# Patient Record
Sex: Male | Born: 2000 | Race: White | Hispanic: No | Marital: Single | Smoking: Current every day smoker
Health system: Southern US, Community
[De-identification: ages and names within clinical notes are randomized; demographics above are authoritative.]

## PROBLEM LIST (undated history)

## (undated) DIAGNOSIS — J45909 Unspecified asthma, uncomplicated: Secondary | ICD-10-CM

## (undated) HISTORY — PX: HERNIA REPAIR: SHX51

---

## 2019-04-01 ENCOUNTER — Other Ambulatory Visit: Payer: Self-pay

## 2019-04-01 ENCOUNTER — Emergency Department (HOSPITAL_BASED_OUTPATIENT_CLINIC_OR_DEPARTMENT_OTHER): Payer: Medicaid Other

## 2019-04-01 ENCOUNTER — Emergency Department (HOSPITAL_BASED_OUTPATIENT_CLINIC_OR_DEPARTMENT_OTHER)
Admission: EM | Admit: 2019-04-01 | Discharge: 2019-04-01 | Disposition: A | Discharge status: Home / Self Care | Payer: Medicaid Other | Attending: Emergency Medicine | Admitting: Emergency Medicine

## 2019-04-01 ENCOUNTER — Encounter (HOSPITAL_BASED_OUTPATIENT_CLINIC_OR_DEPARTMENT_OTHER): Payer: Self-pay | Admitting: *Deleted

## 2019-04-01 DIAGNOSIS — R1031 Right lower quadrant pain: Secondary | ICD-10-CM | POA: Insufficient documentation

## 2019-04-01 DIAGNOSIS — J45909 Unspecified asthma, uncomplicated: Secondary | ICD-10-CM | POA: Diagnosis not present

## 2019-04-01 DIAGNOSIS — F17210 Nicotine dependence, cigarettes, uncomplicated: Secondary | ICD-10-CM | POA: Diagnosis not present

## 2019-04-01 HISTORY — DX: Unspecified asthma, uncomplicated: J45.909

## 2019-04-01 LAB — CBC WITH DIFFERENTIAL/PLATELET
Abs Immature Granulocytes: 0.02 10*3/uL (ref 0.00–0.07)
Basophils Absolute: 0 10*3/uL (ref 0.0–0.1)
Basophils Relative: 0 %
Eosinophils Absolute: 0.1 10*3/uL (ref 0.0–0.5)
Eosinophils Relative: 1 %
HCT: 51.3 % (ref 39.0–52.0)
Hemoglobin: 17.8 g/dL — ABNORMAL HIGH (ref 13.0–17.0)
Immature Granulocytes: 0 %
Lymphocytes Relative: 26 %
Lymphs Abs: 1.8 10*3/uL (ref 0.7–4.0)
MCH: 30.6 pg (ref 26.0–34.0)
MCHC: 34.7 g/dL (ref 30.0–36.0)
MCV: 88.1 fL (ref 80.0–100.0)
Monocytes Absolute: 0.6 10*3/uL (ref 0.1–1.0)
Monocytes Relative: 8 %
Neutro Abs: 4.2 10*3/uL (ref 1.7–7.7)
Neutrophils Relative %: 65 %
Platelets: 226 10*3/uL (ref 150–400)
RBC: 5.82 MIL/uL — ABNORMAL HIGH (ref 4.22–5.81)
RDW: 11.7 % (ref 11.5–15.5)
WBC: 6.6 10*3/uL (ref 4.0–10.5)
nRBC: 0 % (ref 0.0–0.2)

## 2019-04-01 LAB — COMPREHENSIVE METABOLIC PANEL
ALT: 15 U/L (ref 0–44)
AST: 23 U/L (ref 15–41)
Albumin: 4.8 g/dL (ref 3.5–5.0)
Alkaline Phosphatase: 62 U/L (ref 38–126)
Anion gap: 9 (ref 5–15)
BUN: 19 mg/dL (ref 6–20)
CO2: 25 mmol/L (ref 22–32)
Calcium: 9.3 mg/dL (ref 8.9–10.3)
Chloride: 102 mmol/L (ref 98–111)
Creatinine, Ser: 0.88 mg/dL (ref 0.61–1.24)
GFR calc Af Amer: >60 mL/min (ref >60)
GFR calc non Af Amer: >60 mL/min (ref >60)
Glucose, Bld: 81 mg/dL (ref 70–99)
Potassium: 4.1 mmol/L (ref 3.5–5.1)
Sodium: 136 mmol/L (ref 135–145)
Total Bilirubin: 2.6 mg/dL — ABNORMAL HIGH (ref 0.3–1.2)
Total Protein: 7.9 g/dL (ref 6.5–8.1)

## 2019-04-01 LAB — URINALYSIS, ROUTINE W REFLEX MICROSCOPIC
Bilirubin Urine: NEGATIVE
Glucose, UA: NEGATIVE mg/dL
Ketones, ur: 15 mg/dL — AB
Leukocytes,Ua: NEGATIVE
Nitrite: NEGATIVE
Protein, ur: NEGATIVE mg/dL
Specific Gravity, Urine: >1.03 — ABNORMAL HIGH (ref 1.005–1.030)
pH: 6 (ref 5.0–8.0)

## 2019-04-01 LAB — URINALYSIS, MICROSCOPIC (REFLEX)

## 2019-04-01 LAB — LIPASE, BLOOD: Lipase: 26 U/L (ref 11–51)

## 2019-04-01 MED ORDER — METHOCARBAMOL 500 MG PO TABS: 500.0000 mg | ORAL_TABLET | Freq: Two times a day (BID) | ORAL | 0 refills | Status: AC

## 2019-04-01 MED ORDER — KETOROLAC TROMETHAMINE 30 MG/ML IJ SOLN
30.0000 mg | Freq: Once | INTRAMUSCULAR | Status: AC
  Administered 2019-04-01: 30 mg via INTRAVENOUS
  Filled 2019-04-01: qty 1

## 2019-04-01 MED ORDER — ONDANSETRON HCL 4 MG/2ML IJ SOLN
4.0000 mg | Freq: Once | INTRAMUSCULAR | Status: AC
  Administered 2019-04-01: 4 mg via INTRAVENOUS
  Filled 2019-04-01: qty 2

## 2019-04-01 MED ORDER — IOHEXOL 300 MG/ML  SOLN
100.0000 mL | Freq: Once | INTRAMUSCULAR | Status: AC | PRN
  Administered 2019-04-01: 100 mL via INTRAVENOUS

## 2019-04-01 NOTE — ED Triage Notes (Signed)
Pt c/o right lower abd pain  After lifting heavy object x 1 week ago HX hernia

## 2019-04-01 NOTE — ED Provider Notes (Signed)
[facility] EMERGENCY DEPARTMENT Provider Note   CSN: [id number] Arrival date & time: 04/01/19  1648    History   Chief Complaint Chief Complaint  Patient presents with  . Abdominal Pain    HPI Michael Horne is a 18 y.o. male who presents for evaluation of right lower quadrant abdominal pain that began about a week ago.  He states that symptoms initially began while he was at work.  He states that he was lifting something heavy and felt a "tearing type pain in his right lower quadrant."  Since then, he has had constant pain to his right lower abdomen and right groin.  He is concerned that he have developed a hernia.  He reports history of bilateral hernias with here in 2016.  Since then, he always has some chronic pain but states that this is felt worse.  Pain is worse when he is moving or walking around.  He denies any diarrhea or constipation.  He does report one episode of vomiting yesterday.  He states he has had some decreased appetite which he states is abnormal for him.  He has not noted any fevers.  He has been taking Aleve for the pain with no improvement.  Patient denies any dysuria, hematuria, chest pain, difficulty breathing.     The history is provided by the patient.    Past Medical History:  Diagnosis Date  . Asthma     There are no active problems to display for this patient.   Past Surgical History:  Procedure Laterality Date  . HERNIA REPAIR          Home Medications    Prior to Admission medications   Medication Sig Start Date End Date Taking? Authorizing Provider  methocarbamol (ROBAXIN) 500 MG tablet Take 1 tablet (500 mg total) by mouth 2 (two) times daily. 04/01/19   Maxwell CaulLayden, Rishaan Gunner A, PA-C    Family History History reviewed. No pertinent family history.  Social History Social History   Tobacco Use  . Smoking status: Current Every Day Smoker    Packs/day: 0.50    Types: Cigarettes  . Smokeless tobacco: Never Used  Substance Use  Topics  . Alcohol use: Not Currently  . Drug use: Yes    Types: Marijuana     Allergies   Patient has no known allergies.   Review of Systems Review of Systems  Constitutional: Positive for appetite change. Negative for fever.  Respiratory: Negative for cough and shortness of breath.   Cardiovascular: Negative for chest pain.  Gastrointestinal: Positive for abdominal pain, nausea and vomiting.  Genitourinary: Negative for dysuria and hematuria.  Neurological: Negative for headaches.  All other systems reviewed and are negative.    Physical Exam Updated Vital Signs BP 109/73 (BP Location: Right Arm)   Pulse 89   Temp 98 F (36.7 C)   Resp 18   Ht 6\' 3"  (1.905 m)   Wt 86.2 kg   SpO2 100%   BMI 23.75 kg/m   Physical Exam Vitals signs and nursing note reviewed. Exam conducted with a chaperone present.  Constitutional:      Appearance: Normal appearance. He is well-developed.  HENT:     Head: Normocephalic and atraumatic.  Eyes:     General: Lids are normal.     Conjunctiva/sclera: Conjunctivae normal.     Pupils: Pupils are equal, round, and reactive to light.  Neck:     Musculoskeletal: Full passive range of motion without pain.  Cardiovascular:  Rate and Rhythm: Normal rate and regular rhythm.     Pulses: Normal pulses.     Heart sounds: Normal heart sounds. No murmur. No friction rub. No gallop.   Pulmonary:     Effort: Pulmonary effort is normal.     Breath sounds: Normal breath sounds.  Abdominal:     Palpations: Abdomen is soft. Abdomen is not rigid.     Tenderness: There is abdominal tenderness in the right lower quadrant. There is right CVA tenderness. There is no guarding.     Hernia: No hernia is present. There is no hernia in the left inguinal area or right inguinal area.     Comments: Abdomen is soft, nondistended.  Diffuse tenderness in his right lower quadrant with no specific focal point.  No obvious tenderness noted at McBurney's point.  Mild  right-sided CVA tenderness.  Rigidity, guarding.  No peritoneal signs. No palpable abdominal hernia.   Genitourinary:    Penis: Normal. No lesions.      Scrotum/Testes: Normal. Cremasteric reflex is present.        Right: Tenderness or swelling not present. Right testis is descended.        Left: Tenderness or swelling not present. Left testis is descended.     Comments: The exam was performed with a chaperone present. Normal male genitalia. No evidence of rash, ulcers or lesions.  Normal penis. Bilateral testicles are without any overlying warmth, erythema, edema. No palpable mass. No bell clap deformity. Cremasteric reflex present. No hernia palpated bilaterally.  Musculoskeletal: Normal range of motion.  Skin:    General: Skin is warm and dry.     Capillary Refill: Capillary refill takes less than 2 seconds.  Neurological:     Mental Status: He is alert and oriented to person, place, and time.  Psychiatric:        Speech: Speech normal.      ED Treatments / Results  Labs (all labs ordered are listed, but only abnormal results are displayed) Labs Reviewed  URINALYSIS, ROUTINE W REFLEX MICROSCOPIC - Abnormal; Notable for the following components:      Result Value   Specific Gravity, Urine >1.030 (*)    Hgb urine dipstick TRACE (*)    Ketones, ur 15 (*)    All other components within normal limits  COMPREHENSIVE METABOLIC PANEL - Abnormal; Notable for the following components:   Total Bilirubin 2.6 (*)    All other components within normal limits  CBC WITH DIFFERENTIAL/PLATELET - Abnormal; Notable for the following components:   RBC 5.82 (*)    Hemoglobin 17.8 (*)    All other components within normal limits  URINALYSIS, MICROSCOPIC (REFLEX) - Abnormal; Notable for the following components:   Bacteria, UA RARE (*)    All other components within normal limits  LIPASE, BLOOD    EKG None  Radiology Ct Abdomen Pelvis W Contrast  Result Date: 04/01/2019 CLINICAL DATA:   Acute right lower quadrant abdominal pain. EXAM: CT ABDOMEN AND PELVIS WITH CONTRAST TECHNIQUE: Multidetector CT imaging of the abdomen and pelvis was performed using the standard protocol following bolus administration of intravenous contrast. CONTRAST:  125mL OMNIPAQUE IOHEXOL 300 MG/ML  SOLN COMPARISON:  None. FINDINGS: Lower chest: No acute abnormality. Hepatobiliary: No focal liver abnormality is seen. No gallstones, gallbladder wall thickening, or biliary dilatation. Pancreas: Unremarkable. No pancreatic ductal dilatation or surrounding inflammatory changes. Spleen: Normal in size without focal abnormality. Adrenals/Urinary Tract: Adrenal glands are unremarkable. Kidneys are normal, without renal calculi, focal  lesion, or hydronephrosis. Bladder is unremarkable. Stomach/Bowel: Stomach is within normal limits. Appendix appears normal. No evidence of bowel wall thickening, distention, or inflammatory changes. Vascular/Lymphatic: No significant vascular findings are present. No enlarged abdominal or pelvic lymph nodes. Reproductive: Prostate is unremarkable. Other: No abdominal wall hernia or abnormality. No abdominopelvic ascites. Musculoskeletal: No acute or significant osseous findings. IMPRESSION: No abnormality seen in the abdomen or pelvis. Electronically Signed   By: Lupita RaiderJames  Green Jr M.D.   On: 04/01/2019 19:17    Procedures Procedures (including critical care time)  Medications Ordered in ED Medications  ondansetron (ZOFRAN) injection 4 mg (4 mg Intravenous Given 04/01/19 1807)  ketorolac (TORADOL) 30 MG/ML injection 30 mg (30 mg Intravenous Given 04/01/19 1808)  iohexol (OMNIPAQUE) 300 MG/ML solution 100 mL (100 mLs Intravenous Contrast Given 04/01/19 1854)     Initial Impression / Assessment and Plan / ED Course  I have reviewed the triage vital signs and the nursing notes.  Pertinent labs & imaging results that were available during my care of the patient were reviewed by me and considered in  my medical decision making (see chart for details).        18 year old male who presents for evaluation of right sided abdominal/pelvic pain x1 week.  History of bilateral hernia repair in 2016.  Reports proxy 1 week ago, he was moving a heavy thing at work and felt like something was tearing in his right lower quadrant/pelvic region.  Since then has had continued pain.  Also endorses decreased appetite as well as one episode of vomiting yesterday.  No fevers. Patient is afebrile, non-toxic appearing, sitting comfortably on examination table. Vital signs reviewed and stable.  On exam, he does have diffuse tenderness palpation noted to right lower quadrant as well as some mild right-sided CVA tenderness.  No palpable hernia noted on exam.  Additionally, no testicular warmth, erythema, edema, palpable mass noted.  Consider hernia though does not appear strangulated or incarcerated.  Also consider infectious etiology such as appendicitis given location of pain as well as decreased appetite and episode of vomiting.  History/physical exam is not concerning for kidney stone.  History/physical exam not concerning for testicular torsion.  Consider abdominal muscle strain.  CMP shows bili at 2.6.  Otherwise unremarkable.  Lipase within normal limits.  UA shows trace hemoglobin.  No infectious signs.  CBC without any significant leukocytosis or anemia.  CT abdomen pelvis shows no acute abnormality seen in abdomen or pelvis.  No evidence of abdominal wall hernia.  No evidence of appendicitis.  Re-evaluation of patient.  Patient resting comfortably in bed.  He states that his pain is somewhat improved. I discussed with him that this could be evidence of muscle strain given recent history.  On my evaluation, he has no evidence of inguinal hernia that I can palpate.  Additionally, his exam is not concerning for testicular torsion.  Encouraged at home supportive care measures.  We will send him home with a short course  of muscle relaxers for symptomatic relief. At this time, patient exhibits no emergent life-threatening condition that require further evaluation in ED or admission. Patient had ample opportunity for questions and discussion. All patient's questions were answered with full understanding. Strict return precautions discussed. Patient expresses understanding and agreement to plan.   Portions of this note were generated with Scientist, clinical (histocompatibility and immunogenetics)Dragon dictation software. Dictation errors may occur despite best attempts at proofreading.   Final Clinical Impressions(s) / ED Diagnoses   Final diagnoses:  Right lower quadrant  abdominal pain    ED Discharge Orders         Ordered    methocarbamol (ROBAXIN) 500 MG tablet  2 times daily     04/01/19 2029           Rosana HoesLayden, Azara Gemme A, PA-C 04/01/19 2106    Pricilla LovelessGoldston, Scott, MD 04/05/19 [phone]

## 2019-04-01 NOTE — ED Notes (Signed)
ED Provider at bedside. 

## 2019-04-01 NOTE — Discharge Instructions (Signed)
You can take Tylenol or Ibuprofen as directed for pain. You can alternate Tylenol and Ibuprofen every 4 hours. If you take Tylenol at 1pm, then you can take Ibuprofen at 5pm. Then you can take Tylenol again at 9pm.   Take Robaxin as prescribed. This medication will make you drowsy so do not drive or drink alcohol when taking it.  Follow-up with [facility] to establish a primary care doctor if you do not have one.   Follow-up with your surgeon.  Return the emergency department for any fevers, worsening abdominal pain, vomiting or any other worsening or concerning symptoms.

## 2020-12-23 IMAGING — CT CT ABDOMEN AND PELVIS WITH CONTRAST
2 of 5 series · 16 of 46 positions shown, 18 images · IV contrast (APPLIED)
Comparison: None.

CLINICAL DATA: Acute right lower quadrant abdominal pain.

EXAM:
CT ABDOMEN AND PELVIS WITH CONTRAST
TECHNIQUE: Multidetector CT imaging of the abdomen and pelvis was performed
using the standard protocol following bolus administration of
intravenous contrast.
CONTRAST:  100mL OMNIPAQUE IOHEXOL 300 MG/ML  SOLN

[Series 2: axial st · axial · 0.68mm/px · z∈[-665,-235]mm · 13 of 98 slices shown, 15 images]
[im 6/98  soft-tissue]
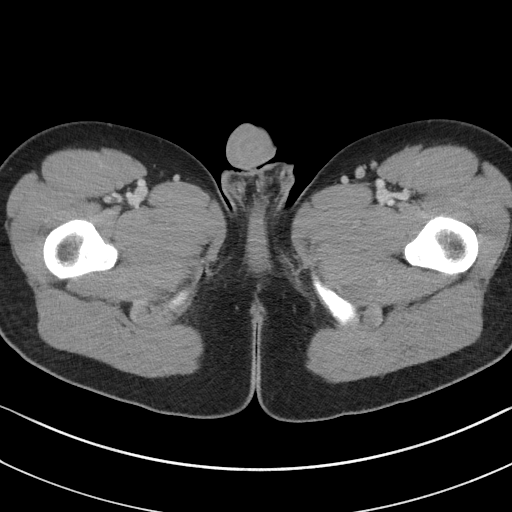
[im 6/98  bone]
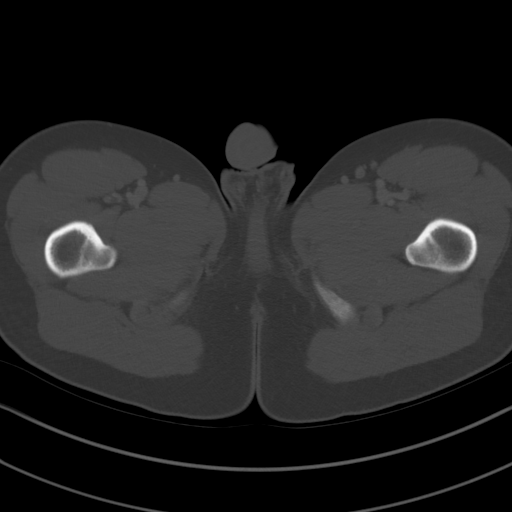
[im 11/98  soft-tissue]
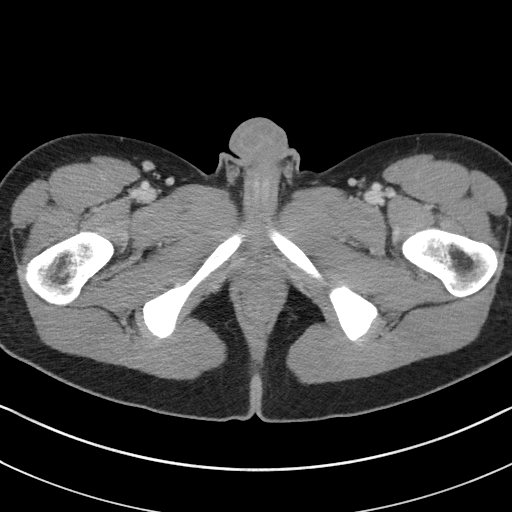
[im 22/98  soft-tissue]
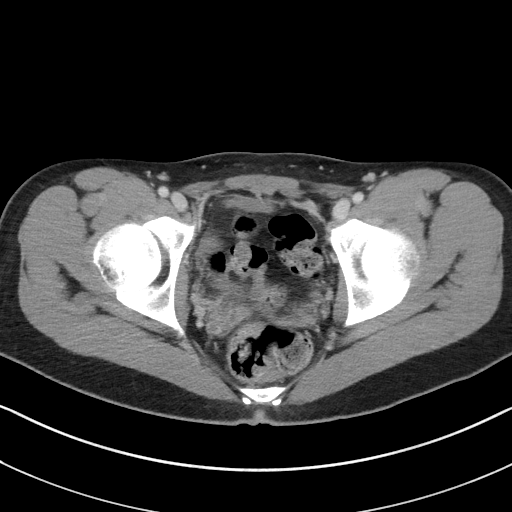
[im 27/98  soft-tissue]
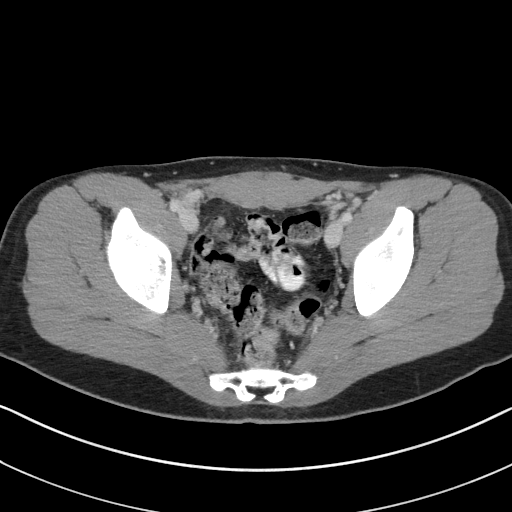
[im 33/98  soft-tissue]
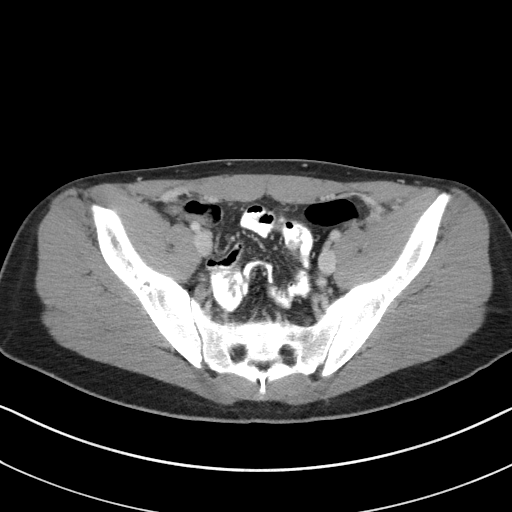
[im 44/98  soft-tissue]
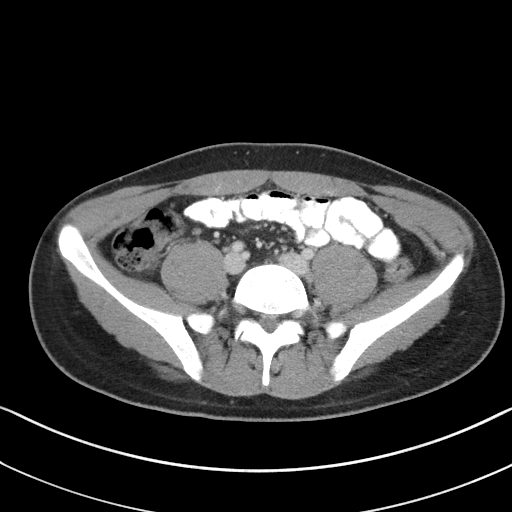
[im 49/98  soft-tissue]
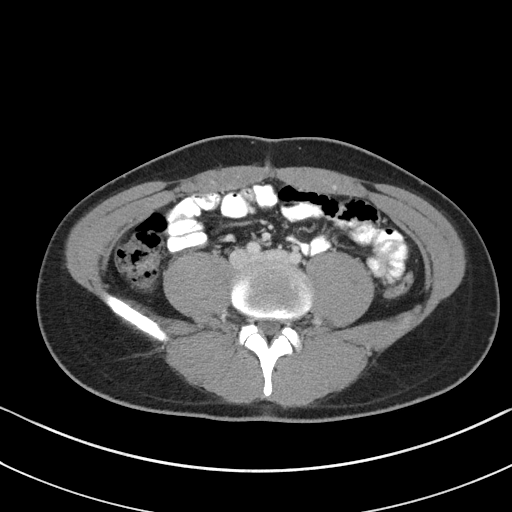
[im 54/98  soft-tissue]
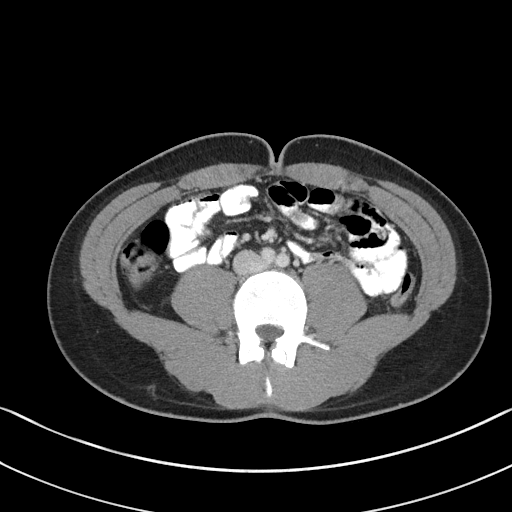
[im 65/98  soft-tissue]
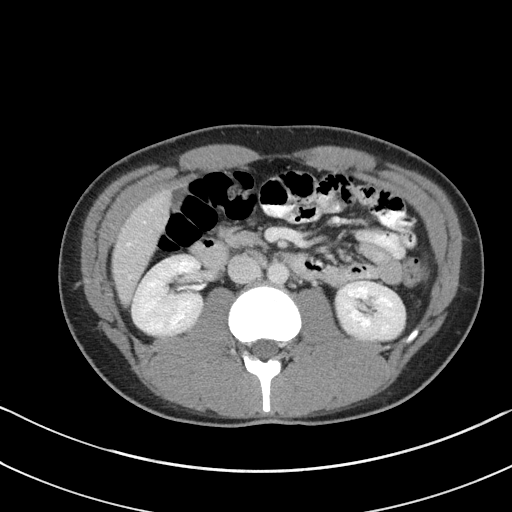
[im 65/98  bone]
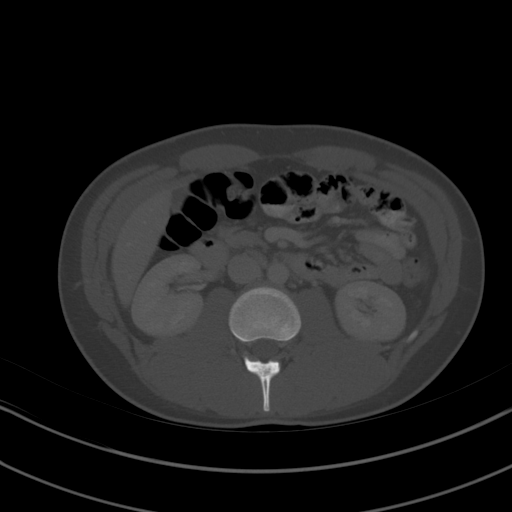
[im 71/98  soft-tissue]
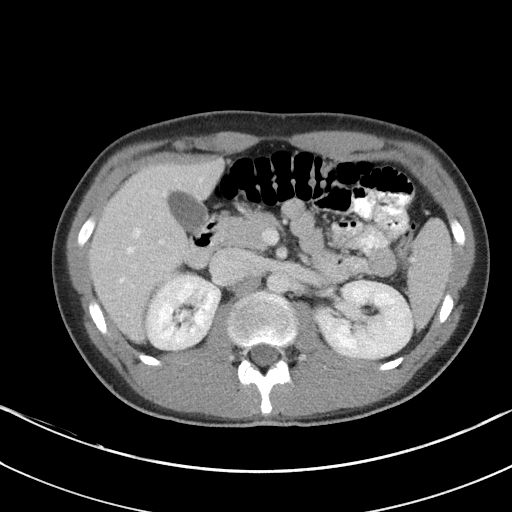
[im 76/98  soft-tissue]
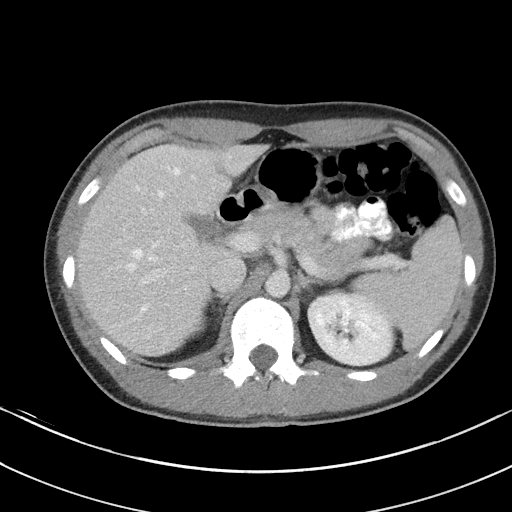
[im 87/98  soft-tissue]
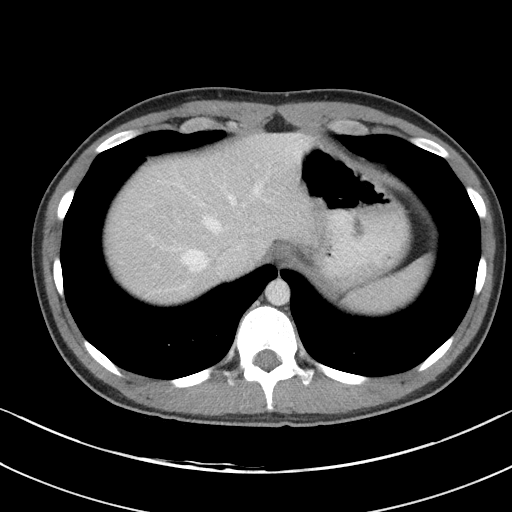
[im 92/98  soft-tissue]
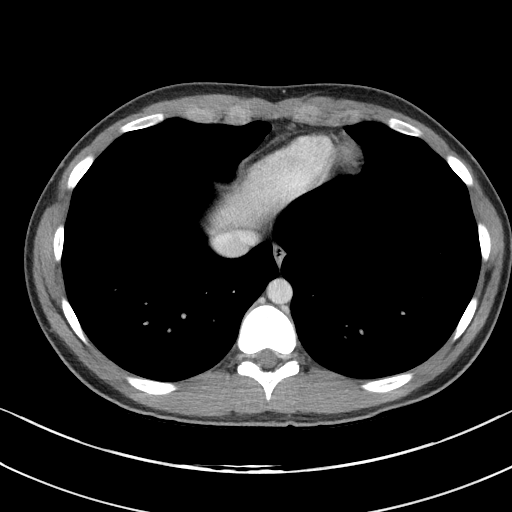

[Series 5: coronal st · coronal · 0.67mm/px · 3 of 79 slices shown]
[im 27/79  soft-tissue]
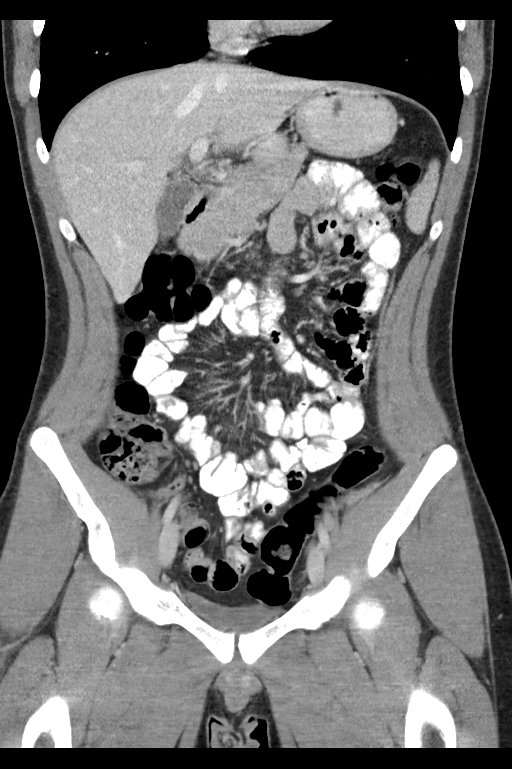
[im 35/79  soft-tissue]
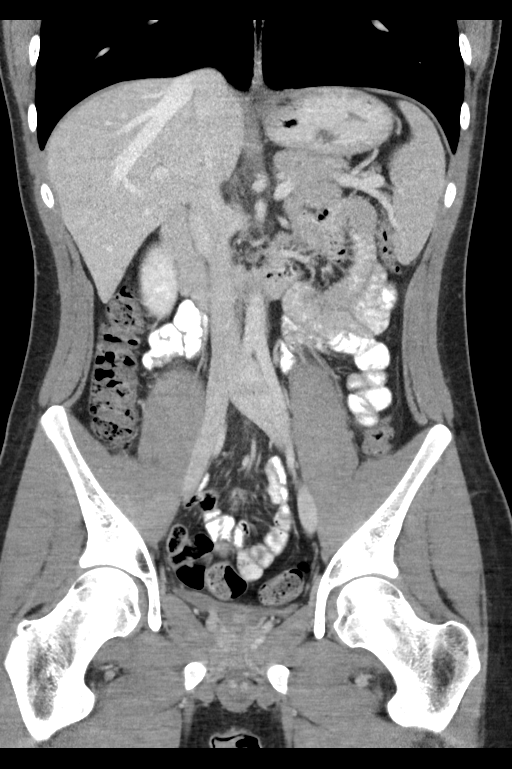
[im 44/79  soft-tissue]
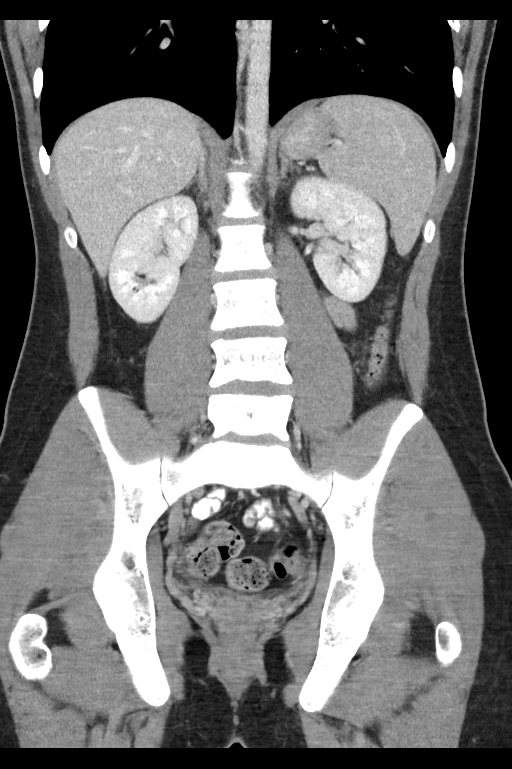

[16 of 46 positions shown; findings below may reference images not displayed]

FINDINGS: Lower chest: No acute abnormality.

Hepatobiliary: No focal liver abnormality is seen. No gallstones,
gallbladder wall thickening, or biliary dilatation.

Pancreas: Unremarkable. No pancreatic ductal dilatation or
surrounding inflammatory changes.

Spleen: Normal in size without focal abnormality.

Adrenals/Urinary Tract: Adrenal glands are unremarkable. Kidneys are
normal, without renal calculi, focal lesion, or hydronephrosis.
Bladder is unremarkable.

Stomach/Bowel: Stomach is within normal limits. Appendix appears
normal. No evidence of bowel wall thickening, distention, or
inflammatory changes.

Vascular/Lymphatic: No significant vascular findings are present. No
enlarged abdominal or pelvic lymph nodes.

Reproductive: Prostate is unremarkable.

Other: No abdominal wall hernia or abnormality. No abdominopelvic
ascites.

Musculoskeletal: No acute or significant osseous findings.
IMPRESSION: No abnormality seen in the abdomen or pelvis.
